# Patient Record
Sex: Female | Born: 1950 | Race: White | Hispanic: No | Marital: Married | Smoking: Never smoker
Health system: Southern US, Community
[De-identification: ages and names within clinical notes are randomized; demographics above are authoritative.]

## PROBLEM LIST (undated history)

## (undated) DIAGNOSIS — T148XXA Other injury of unspecified body region, initial encounter: Secondary | ICD-10-CM

## (undated) HISTORY — PX: OTHER SURGICAL HISTORY: SHX169

## (undated) HISTORY — PX: BREAST BIOPSY: SHX20

## (undated) HISTORY — PX: BREAST EXCISIONAL BIOPSY: SUR124

---

## 2005-04-03 ENCOUNTER — Ambulatory Visit: Payer: Self-pay | Admitting: Family Medicine

## 2005-06-16 ENCOUNTER — Emergency Department: Payer: Self-pay | Admitting: Unknown Physician Specialty

## 2007-10-21 ENCOUNTER — Ambulatory Visit: Payer: Self-pay | Admitting: Unknown Physician Specialty

## 2007-10-23 ENCOUNTER — Ambulatory Visit: Payer: Self-pay | Admitting: Unknown Physician Specialty

## 2015-09-10 ENCOUNTER — Ambulatory Visit (INDEPENDENT_AMBULATORY_CARE_PROVIDER_SITE_OTHER): Payer: BLUE CROSS/BLUE SHIELD

## 2015-09-10 ENCOUNTER — Ambulatory Visit
Admission: EM | Admit: 2015-09-10 | Discharge: 2015-09-10 | Disposition: A | Discharge status: Home / Self Care | Payer: BLUE CROSS/BLUE SHIELD | Attending: Family Medicine | Admitting: Family Medicine

## 2015-09-10 DIAGNOSIS — S93602A Unspecified sprain of left foot, initial encounter: Secondary | ICD-10-CM | POA: Diagnosis not present

## 2015-09-10 DIAGNOSIS — S9032XA Contusion of left foot, initial encounter: Secondary | ICD-10-CM

## 2015-09-10 HISTORY — DX: Other injury of unspecified body region, initial encounter: T14.8XXA

## 2015-09-10 MED ORDER — TRAMADOL HCL 50 MG PO TABS: 50.0000 mg | ORAL_TABLET | Freq: Three times a day (TID) | ORAL | Status: AC | PRN

## 2015-09-10 NOTE — ED Notes (Addendum)
Patient c/o left foot pain.  Hx. of broken ankle 24 years ago when a horse fell on her ankle.  This morning both of her 100lbs dogs ran into her leg at the same time, causing her to fall down, and resulting in both dogs stepped on the ankle as well. She is unable to bear weight at all, but did take 4 tabs of Ibuprofen  which has helped with the pain.

## 2015-09-10 NOTE — Discharge Instructions (Signed)
Take medication as prescribed. Rest.Apply ice, elevate. Use crutches. Gradually apply weight after 3 days as tolerated, as discussed.   Follow up with orthopedic next week as needed for continued pain.    Follow up with your primary care physician this week as needed. Return to Urgent care for new or worsening concerns.    Foot Contusion A foot contusion is a deep bruise to the foot. Contusions are the result of an injury that caused bleeding under the skin. The contusion may turn blue, purple, or yellow. Minor injuries will give you a painless contusion, but more severe contusions may stay painful and swollen for a few weeks. CAUSES  A foot contusion comes from a direct blow to that area, such as a heavy object falling on the foot. SYMPTOMS   Swelling of the foot.  Discoloration of the foot.  Tenderness or soreness of the foot. DIAGNOSIS  You will have a physical exam and will be asked about your history. You may need an X-ray of your foot to look for a broken bone (fracture).  TREATMENT  An elastic wrap may be recommended to support your foot. Resting, elevating, and applying cold compresses to your foot are often the best treatments for a foot contusion. Over-the-counter medicines may also be recommended for pain control. HOME CARE INSTRUCTIONS   Put ice on the injured area.  Put ice in a plastic bag.  Place a towel between your skin and the bag.  Leave the ice on for 15-20 minutes, 03-04 times a day.  Only take over-the-counter or prescription medicines for pain, discomfort, or fever as directed by your caregiver.  If told, use an elastic wrap as directed. This can help reduce swelling. You may remove the wrap for sleeping, showering, and bathing. If your toes become numb, cold, or blue, take the wrap off and reapply it more loosely.  Elevate your foot with pillows to reduce swelling.  Try to avoid standing or walking while the foot is painful. Do not resume use until  instructed by your caregiver. Then, begin use gradually. If pain develops, decrease use. Gradually increase activities that do not cause discomfort until you have normal use of your foot.  See your caregiver as directed. It is very important to keep all follow-up appointments in order to avoid any lasting problems with your foot, including long-term (chronic) pain. SEEK IMMEDIATE MEDICAL CARE IF:   You have increased redness, swelling, or pain in your foot.  Your swelling or pain is not relieved with medicines.  You have loss of feeling in your foot or are unable to move your toes.  Your foot turns cold or blue.  You have pain when you move your toes.  Your foot becomes warm to the touch.  Your contusion does not improve in 2 days. MAKE SURE YOU:   Understand these instructions.  Will watch your condition.  Will get help right away if you are not doing well or get worse.   This information is not intended to replace advice given to you by your health care provider. Make sure you discuss any questions you have with your health care provider.   Document Released: 04/24/2006 Document Revised: 01/02/2012 Document Reviewed: 03/09/2015 Elsevier Interactive Patient Education 2016 [employer]  Foot Sprain A foot sprain is an injury to one of the strong bands of tissue (ligaments) that connect and support the many bones in your feet. The ligament can be stretched too much or it can tear. A tear  can be either partial or complete. The severity of the sprain depends on how much of the ligament was damaged or torn. CAUSES A foot sprain is usually caused by suddenly twisting or pivoting your foot. RISK FACTORS This injury is more likely to occur in people who:  Play a sport, such as basketball or football.  Exercise or play a sport without warming up.  Start a new workout or sport.  Suddenly increase how long or hard they exercise or play a sport. SYMPTOMS Symptoms of this  condition start soon after an injury and include:  Pain, especially in the arch of the foot.  Bruising.  Swelling.  Inability to walk or use the foot to support body weight. DIAGNOSIS This condition is diagnosed with a medical history and physical exam. You may also have imaging tests, such as:  X-rays to make sure there are no broken bones (fractures).  MRI to see if the ligament has torn. TREATMENT Treatment varies depending on the severity of your sprain. Mild sprains can be treated with rest, ice, compression, and elevation (RICE). If your ligament is overstretched or partially torn, treatment usually involves keeping your foot in a fixed position (immobilization) for a period of time. To help you do this, your health care provider will apply a bandage, splint, or walking boot to keep your foot from moving until it heals. You may also be advised to use crutches or a scooter for a few weeks to avoid bearing weight on your foot while it is healing. If your ligament is fully torn, you may need surgery to reconnect the ligament to the bone. After surgery, a cast or splint will be applied and will need to stay on your foot while it heals. Your health care provider may also suggest exercises or physical therapy to strengthen your foot. HOME CARE INSTRUCTIONS If You Have a Bandage, Splint, or Walking Boot:  Wear it as directed by your health care provider. Remove it only as directed by your health care provider.  Loosen the bandage, splint, or walking boot if your toes become numb and tingle, or if they turn cold and blue. Bathing  If your health care provider approves bathing and showering, cover the bandage or splint with a watertight plastic bag to protect it from water. Do not let the bandage or splint get wet. Managing Pain, Stiffness, and Swelling   If directed, apply ice to the injured area:  Put ice in a plastic bag.  Place a towel between your skin and the bag.  Leave the  ice on for 20 minutes, 2-3 times per day.  Move your toes often to avoid stiffness and to lessen swelling.  Raise (elevate) the injured area above the level of your heart while you are sitting or lying down. Driving  Do not drive or operate heavy machinery while taking pain medicine.  Do not drive while wearing a bandage, splint, or walking boot on a foot that you use for driving. Activity  Rest as directed by your health care provider.  Do not use the injured foot to support your body weight until your health care provider says that you can. Use crutches or other supportive devices as directed by your health care provider.  Ask your health care provider what activities are safe for you. Gradually increase how much and how far you walk until your health care provider says it is safe to return to full activity.  Do any exercise or physical therapy as directed  by your health care provider. General Instructions  If a splint was applied, do not put pressure on any part of it until it is fully hardened. This may take several hours.  Take medicines only as directed by your health care provider. These include over-the-counter medicines and prescription medicines.  Keep all follow-up visits as directed by your health care provider. This is important.  When you can walk without pain, wear supportive shoes that have stiff soles. Do not wear flip-flops, and do not walk barefoot. SEEK MEDICAL CARE IF:  Your pain is not controlled with medicine.  Your bruising or swelling gets worse or does not get better with treatment.  Your splint or walking boot is damaged. SEEK IMMEDIATE MEDICAL CARE IF:  Your foot is numb or blue.  Your foot feels colder than normal.   This information is not intended to replace advice given to you by your health care provider. Make sure you discuss any questions you have with your health care provider.   Document Released: 12/23/2001 Document Revised: 11/17/2014  Document Reviewed: 05/06/2014 Elsevier Interactive Patient Education [employer]

## 2015-09-10 NOTE — ED Provider Notes (Signed)
[facility]  ____________________________________________  Time seen: Approximately 1:09 PM  I have reviewed the triage vital signs and the nursing notes.   HISTORY  Chief Complaint Foot Pain   HPI Katherine Yates is a 65 y.o. female presents with husband at bedside for the complaints of left foot pain. Patient reports that approximately 10 AM this morning she was playing with her dogs. Patient states that she has 2 large dogs that approximately 100 pounds. Patient states that they get really excited and accidentally knocked her down. Patient states that she rolled left foot when she fell and they accidentally stepped on left foot as well. Denies head injury or loss of consciousness. Denies other pain or injury. Denies neck or back pain or injury. Reports pain is currently 7 out of 10 aching and throbbing and worse with weight bearing. Denies head injury or loss consciousness. Denies neck or back pain or injury. Denies weakness, dizziness, chest pain, shortness of breath. Reports fell only because her dog knocked her over.Patient states that she has not been able to apply weight on ankle since. States had crutches at home and has been using them.   Denies head injury or loss of consciousness. Denies neck or back pain or injury. Denies weakness, dizziness, chest pain, shortness of breath. Reports fell only because her dog knocked her over.  Past Medical History  Diagnosis Date  . Broken bones   Left ankle fracture in past 20+ years ago: denies surgery to ankle.  There are no active problems to display for this patient.   Past Surgical History  Procedure Laterality Date  . Right knee surgery      Current Outpatient Rx  Name  Route  Sig  Dispense  Refill  . predniSONE (DELTASONE) 10 MG tablet   Oral   Take 10 mg by mouth taper from 4 doses each day to 1 dose and stop.         .             Allergies Codeine and Lavender oil  History reviewed. No pertinent family  history.  Social History Social History  Substance Use Topics  . Smoking status: Never Smoker   . Smokeless tobacco: Never Used  . Alcohol Use: No    Review of Systems Constitutional: No fever/chills Eyes: No visual changes. ENT: No sore throat. Cardiovascular: Denies chest pain. Respiratory: Denies shortness of breath. Gastrointestinal: No abdominal pain.  No nausea, no vomiting.  No diarrhea.  No constipation. Genitourinary: Negative for dysuria. Musculoskeletal: Negative for back pain. Positive left foot pain. Skin: Negative for rash. Neurological: Negative for headaches, focal weakness or numbness.  10-point ROS otherwise negative.  ____________________________________________   PHYSICAL EXAM:  VITAL SIGNS: ED Triage Vitals  Enc Vitals Group     BP 09/10/15 1206 93/64 mmHg     Pulse Rate 09/10/15 1206 94     Resp 09/10/15 1206 18     Temp 09/10/15 1206 98.2 F (36.8 C)     Temp Source 09/10/15 1206 Oral     SpO2 09/10/15 1206 98 %     Weight 09/10/15 1206 147 lb (66.679 kg)     Height 09/10/15 1206 5' 7.75" (1.721 m)     Head Cir --      Peak Flow --      Pain Score 09/10/15 1214 3     Pain Loc --      Pain Edu? --      Excl. in  GC? --     Constitutional: Alert and oriented. Well appearing and in no acute distress. Eyes: Conjunctivae are normal. PERRL. EOMI. Head: Atraumatic. No ecchymosis. Nontender.  Nose: No congestion/rhinnorhea.  Mouth/Throat: Mucous membranes are moist. Neck: No stridor.  No cervical spine tenderness to palpation. Hematological/Lymphatic/Immunilogical: No cervical lymphadenopathy. Cardiovascular: Normal rate, regular rhythm. Grossly normal heart sounds.  Good peripheral circulation. Respiratory: Normal respiratory effort.  No retractions. Lungs CTAB. No wheezes, rales or rhonchi. Good air movement. Gastrointestinal: Soft and nontender Musculoskeletal: No lower or upper extremity tenderness nor edema.  No cervical, thoracic or  lumbar tenderness to palpation. Bilateral pedal pulses equal and easily palpated.  Except: Left dorsal lateral foot mild swelling, mild ecchymosis, moderate tenderness to palpation, skin intact, full range of motion, pain with plantar flexion and dorsiflexion and ankle rotation. Full range of motion to left foot and left toes. Cap refill less than 2 seconds to all distal toes. Left lower extremity otherwise nontender. Gait not tested due to pain. Neurologic:  Normal speech and language. No gross focal neurologic deficits are appreciated.  Skin:  Skin is warm, dry and intact. No rash noted. Psychiatric: Mood and affect are normal. Speech and behavior are normal.  ____________________________________________   LABS (all labs ordered are listed, but only abnormal results are displayed)  Labs Reviewed - No data to display ____________________________________________  RADIOLOGY  XAM: LEFT FOOT - COMPLETE 3+ VIEW  COMPARISON: None.  FINDINGS: Frontal, oblique, and lateral views were obtained. There is no demonstrable fracture or dislocation. The joint spaces appear normal. No erosive change. There are small inferior and posterior calcaneal spurs.  IMPRESSION: No demonstrable fracture or dislocation. Small calcaneal spurs. No appreciable arthropathic change.   Electronically Signed By: Bretta Bang III M.D. On: 09/10/2015 12:47      I, Renford Dills, personally viewed and evaluated these images (plain radiographs) as part of my medical decision making, as well as reviewing the written report by the radiologist.  ____________________________________________   PROCEDURES  Procedure(s) performed:  Ace wrap applied to the left foot by RN. Neurovascular intact. Has crutches at bedside.  _______________________________________   INITIAL IMPRESSION / ASSESSMENT AND PLAN / ED COURSE  Pertinent labs & imaging results that were available during my care of the patient  were reviewed by me and considered in my medical decision making (see chart for details).  Very well-appearing patient. No acute distress. Presents for complaints of left lateral foot pain post mechanical injury this morning after being knocked over by her large dogs. Denies head injury or loss consciousness. Reports only fell due to dogs knocking her over.  Denies other pain or injury. Left lateral dorsal foot mild ecchymosis and mild swelling with mild to moderate tenderness to palpation. Full range of motion. Neurovascular intact. Otherwise nontender left lower extremity. Will evaluate x-ray.  Left foot x-ray noted demonstrate no fracture or dislocation, small calcaneal spurs, no appreciable arthritic change per radiologist.  Figure 8 Ace wrap applied. Use crutches. Encouraged rest, ice, elevation, over-the-counter Tylenol or ibuprofen as needed. When necessary tramadol. Patient reports tolerated tramadol well in the past. Gradually apply weight as tolerated after 3 days. Encouraged PCP follow-up or orthopedic follow-up as needed for continued pain.   Discussed follow up with Primary care physician this week. Discussed follow up and return parameters including no resolution or any worsening concerns. Patient verbalized understanding and agreed to plan.   ____________________________________________   FINAL CLINICAL IMPRESSION(S) / ED DIAGNOSES  Final diagnoses:  Foot sprain, left,  initial encounter  Foot contusion, left, initial encounter      Note: This dictation was prepared with Dragon dictation along with smaller phrase technology. Any transcriptional errors that result from this process are unintentional.    Renford Dills, NP 09/10/15 1928

## 2019-12-02 ENCOUNTER — Other Ambulatory Visit: Payer: Self-pay | Admitting: Family Medicine

## 2019-12-02 DIAGNOSIS — Z1231 Encounter for screening mammogram for malignant neoplasm of breast: Secondary | ICD-10-CM

## 2019-12-08 ENCOUNTER — Other Ambulatory Visit: Payer: Self-pay

## 2019-12-08 ENCOUNTER — Ambulatory Visit
Admission: RE | Admit: 2019-12-08 | Discharge: 2019-12-08 | Disposition: A | Discharge status: Home / Self Care | Payer: Medicare HMO | Source: Ambulatory Visit | Attending: Family Medicine | Admitting: Family Medicine

## 2019-12-08 ENCOUNTER — Encounter (INDEPENDENT_AMBULATORY_CARE_PROVIDER_SITE_OTHER): Payer: Self-pay

## 2019-12-08 DIAGNOSIS — Z1231 Encounter for screening mammogram for malignant neoplasm of breast: Secondary | ICD-10-CM | POA: Insufficient documentation

## 2021-02-03 IMAGING — MG DIGITAL SCREENING BILAT W/ TOMO W/ CAD
8 series · 8 of 24 positions shown · non-contrast
Comparison: Previous exam(s).

CLINICAL DATA: Screening.

EXAM:
DIGITAL SCREENING BILATERAL MAMMOGRAM WITH TOMO AND CAD

[R CC synth-2D]
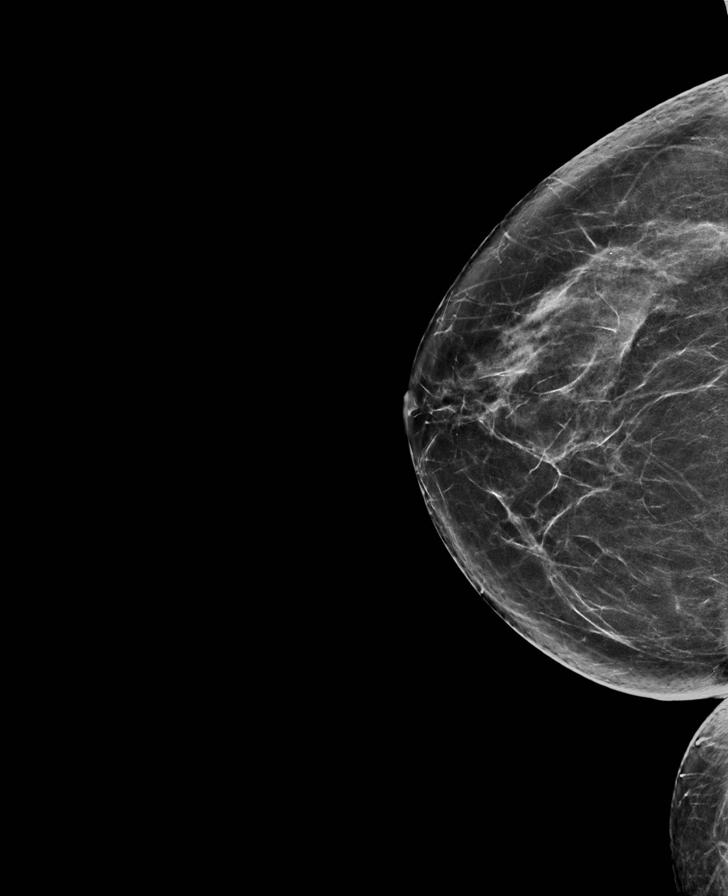

[L MLO synth-2D]
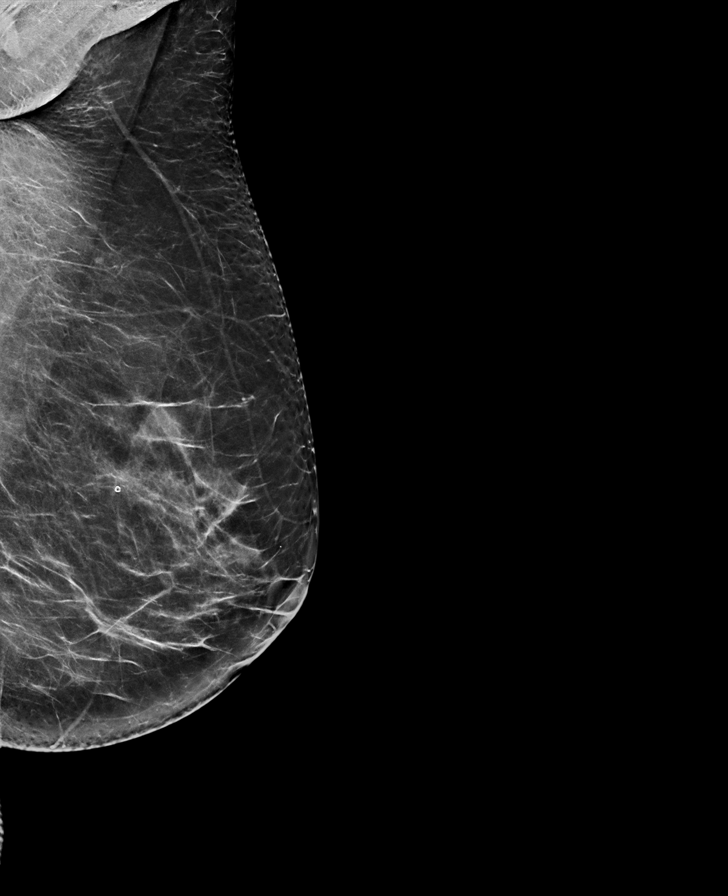

[L CC synth-2D]
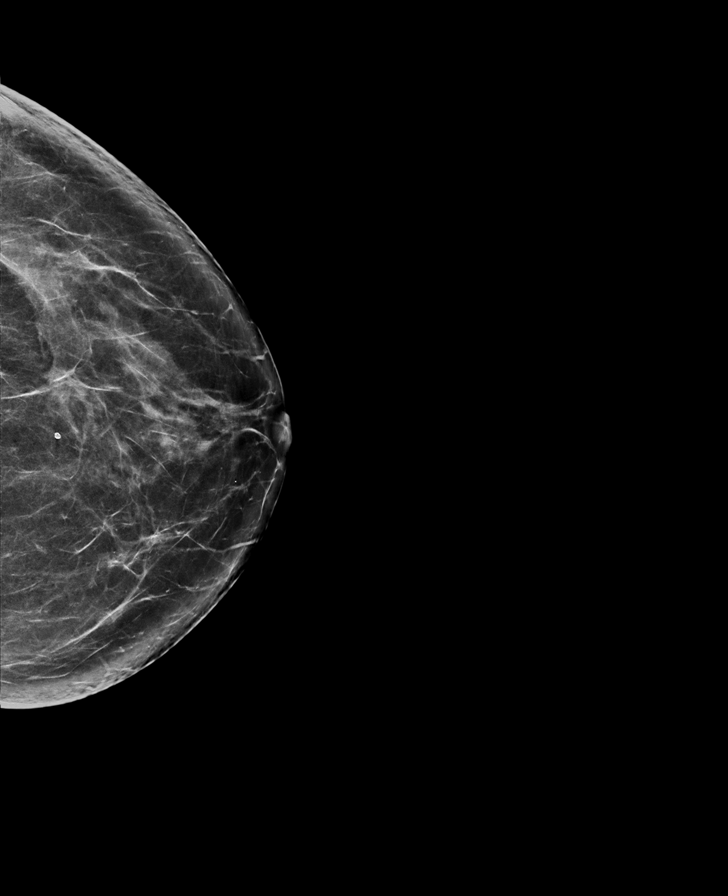

[R MLO synth-2D]
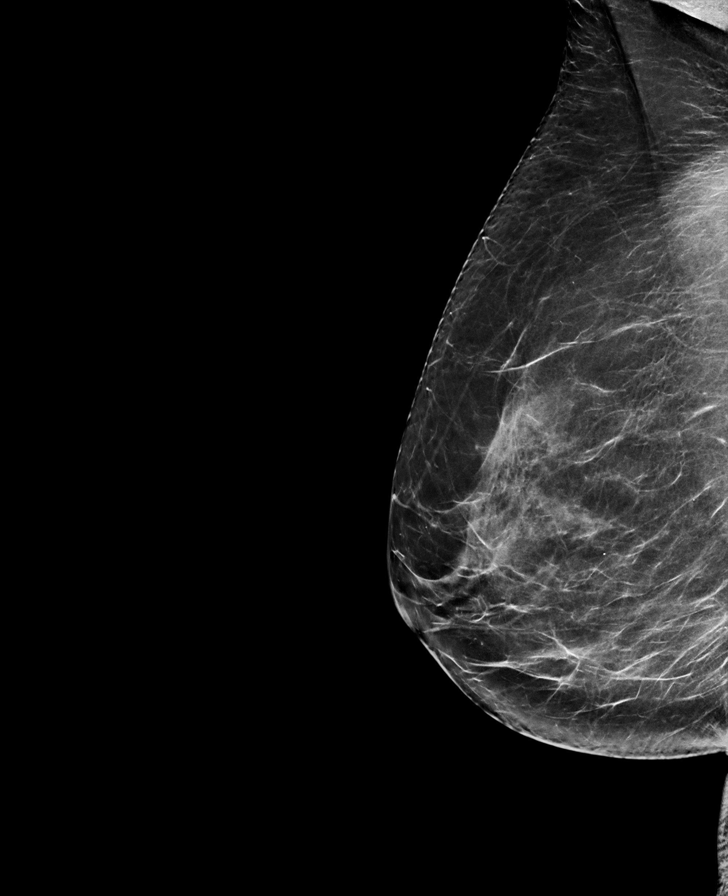

[L MLO tomo · tomo slice 39/76.0]
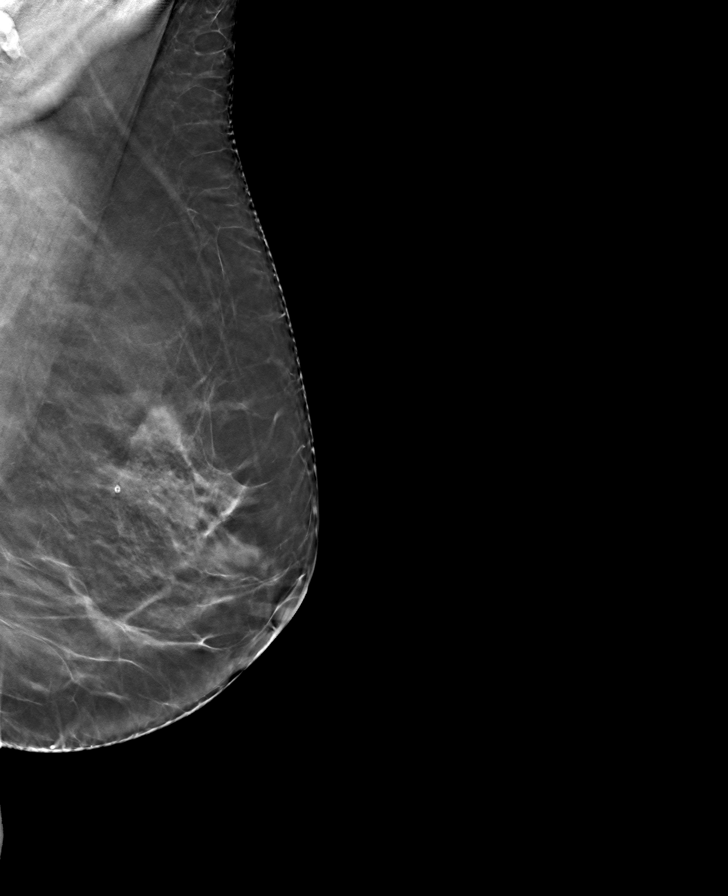

[R MLO tomo · tomo slice 38/75.0]
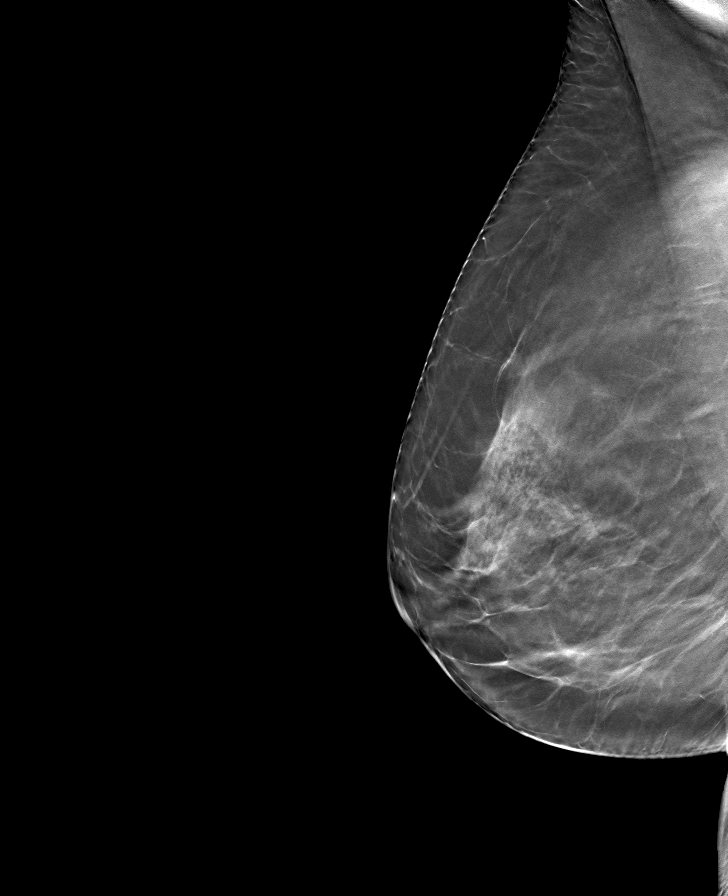

[L CC tomo · tomo slice 39/76.0]
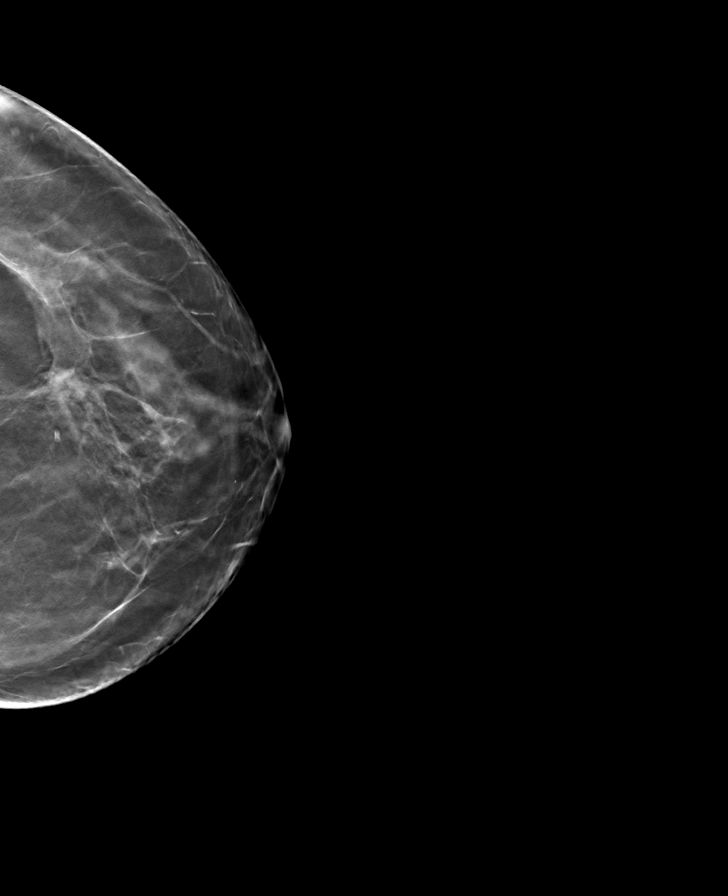

[R CC tomo · tomo slice 37/73.0]
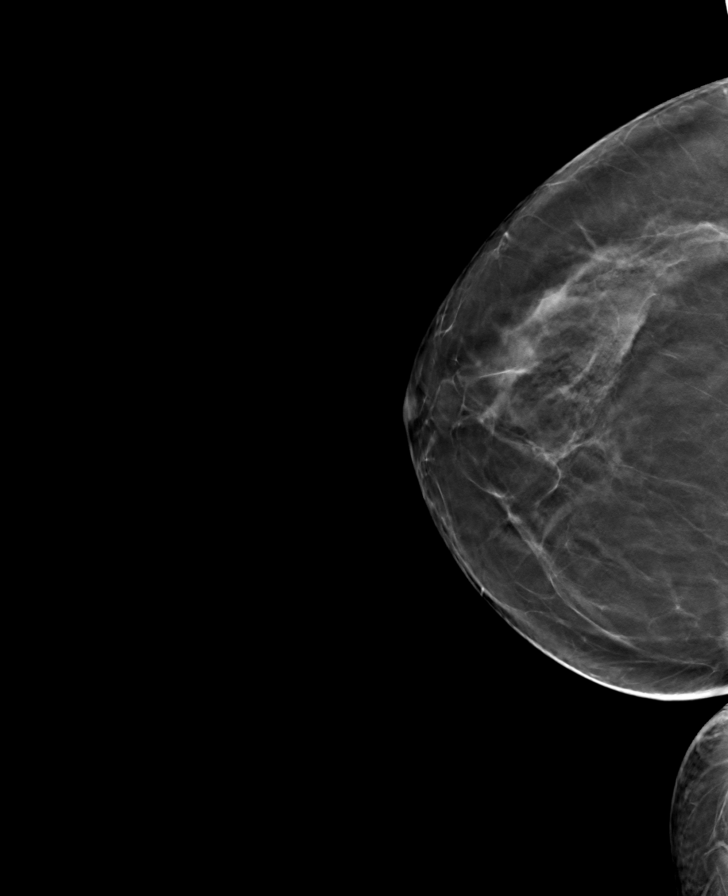

[8 of 24 positions shown; findings below may reference images not displayed]

ACR Breast Density Category b: There are scattered areas of
fibroglandular density.
FINDINGS: There are no findings suspicious for malignancy. Images were
processed with CAD.
IMPRESSION: No mammographic evidence of malignancy. A result letter of this
screening mammogram will be mailed directly to the patient.

RECOMMENDATION:
Screening mammogram in one year. (Code:[id number])

BI-RADS CATEGORY  1: Negative.

## 2022-04-18 ENCOUNTER — Other Ambulatory Visit: Payer: Self-pay | Admitting: Family Medicine

## 2022-04-18 DIAGNOSIS — Z1231 Encounter for screening mammogram for malignant neoplasm of breast: Secondary | ICD-10-CM

## 2022-05-09 ENCOUNTER — Inpatient Hospital Stay: Admission: RE | Admit: 2022-05-09 | Payer: Medicare HMO | Source: Ambulatory Visit

## 2022-12-20 ENCOUNTER — Other Ambulatory Visit: Payer: Self-pay | Admitting: Family Medicine

## 2022-12-20 DIAGNOSIS — Z780 Asymptomatic menopausal state: Secondary | ICD-10-CM

## 2022-12-20 DIAGNOSIS — Z13820 Encounter for screening for osteoporosis: Secondary | ICD-10-CM

## 2022-12-25 ENCOUNTER — Ambulatory Visit
Admission: RE | Admit: 2022-12-25 | Discharge: 2022-12-25 | Disposition: A | Discharge status: Home / Self Care | Payer: Medicare HMO | Source: Ambulatory Visit | Attending: Family Medicine | Admitting: Family Medicine

## 2022-12-25 DIAGNOSIS — Z1231 Encounter for screening mammogram for malignant neoplasm of breast: Secondary | ICD-10-CM | POA: Diagnosis not present

## 2023-02-07 ENCOUNTER — Inpatient Hospital Stay: Admission: RE | Admit: 2023-02-07 | Payer: Medicare HMO | Source: Ambulatory Visit

## 2023-03-07 ENCOUNTER — Ambulatory Visit
Admission: RE | Admit: 2023-03-07 | Discharge: 2023-03-07 | Disposition: A | Discharge status: Home / Self Care | Payer: Medicare HMO | Source: Ambulatory Visit | Attending: Family Medicine | Admitting: Family Medicine

## 2023-03-07 DIAGNOSIS — Z13820 Encounter for screening for osteoporosis: Secondary | ICD-10-CM | POA: Diagnosis present

## 2023-03-07 DIAGNOSIS — Z780 Asymptomatic menopausal state: Secondary | ICD-10-CM | POA: Diagnosis present
